# Patient Record
Sex: Male | Born: 1960 | Race: White | Hispanic: No | Marital: Married | Smoking: Never smoker
Health system: Southern US, Community
[De-identification: ages and names within clinical notes are randomized; demographics above are authoritative.]

## PROBLEM LIST (undated history)

## (undated) HISTORY — PX: TOTAL HIP ARTHROPLASTY: SHX124

---

## 2019-01-18 ENCOUNTER — Other Ambulatory Visit: Payer: Self-pay

## 2019-01-18 ENCOUNTER — Emergency Department (INDEPENDENT_AMBULATORY_CARE_PROVIDER_SITE_OTHER)
Admission: EM | Admit: 2019-01-18 | Discharge: 2019-01-18 | Disposition: A | Discharge status: Home / Self Care | Payer: 59 | Source: Home / Self Care

## 2019-01-18 DIAGNOSIS — R040 Epistaxis: Secondary | ICD-10-CM | POA: Diagnosis not present

## 2019-01-18 NOTE — ED Triage Notes (Signed)
Pt c/o severe nosebleed that started just before 11 am at work. Worked on it for 15 mins before heading to urgent care.

## 2019-01-18 NOTE — ED Provider Notes (Signed)
[name]    CSN: [id number] Arrival date & time: 01/18/19  1201     History   Chief Complaint Chief Complaint  Patient presents with  . Epistaxis    HPI Jason Rocha is a 58 y.o. male.   58 yo man with epistaxis for 1 hour PTA from both nostrils.  No recent URI or allergies.  No h/o epistaxis.  No allergies, hypertension, aspirin use.  No recent trauma.  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  Initial visit to [facility].     History reviewed. No pertinent past medical history.  There are no active problems to display for this patient.   History reviewed. No pertinent surgical history.     Home Medications    Prior to Admission medications   Not on File    Family History History reviewed. No pertinent family history.  Social History Social History   Tobacco Use  . Smoking status: Never Smoker  . Smokeless tobacco: Never Used  Substance Use Topics  . Alcohol use: Yes    Frequency: Never    Comment: occ  . Drug use: Never     Allergies   Patient has no known allergies.   Review of Systems Review of Systems   Physical Exam Triage Vital Signs ED Triage Vitals  Enc Vitals Group     BP      Pulse      Resp      Temp      Temp src      SpO2      Weight      Height      Head Circumference      Peak Flow      Pain Score      Pain Loc      Pain Edu?      Excl. in GC?    No data found.  Updated Vital Signs BP (!) 145/82 (BP Location: Right Arm)   Pulse 92   Resp 18   SpO2 93%    Physical Exam Vitals signs and nursing note reviewed.  Constitutional:      Appearance: Normal appearance. He is obese. He is not ill-appearing.     Comments: Patient actively bleeding, brought back immediately to procedure room where compression initiated followed by oxymetazoline spray bilaterally.  HENT:     Nose:     Comments: As above,  active bleeding from both nasal passages with no subsequent bleeding.  Entire septum white on both sides with no friable mucosa noted.    Mouth/Throat:     Comments: Patient expectorated large clot Eyes:     Conjunctiva/sclera: Conjunctivae normal.  Cardiovascular:     Rate and Rhythm: Normal rate and regular rhythm.     Pulses: Normal pulses.  Pulmonary:     Effort: Pulmonary effort is normal.  Musculoskeletal:        General: No signs of injury.  Skin:    General: Skin is warm and dry.  Neurological:     General: No focal deficit present.     Mental Status: He is alert.      UC Treatments / Results  Labs (all labs ordered are listed, but only abnormal results are displayed) Labs Reviewed - No data to display  EKG None  Radiology No results found.  Procedures Procedures (including critical care time)  Medications Ordered in UC  Medications - No data to display  Initial Impression / Assessment and Plan / UC Course  I have reviewed the triage vital signs and the nursing notes.  Pertinent labs & imaging results that were available during my care of the patient were reviewed by me and considered in my medical decision making (see chart for details).    Final Clinical Impressions(s) / UC Diagnoses   Final diagnoses:  Epistaxis     Discharge Instructions     Use the oxymetazoline spray again tonight.  You need to have a recheck in a week, either here or with your primary care provider or ear-nose-throat specialist to recheck the lining of the nose.  Avoid blowing the nose (sniffing is okay) for 24 hours.  No antihistamines since they will dry out the nose.  No aspirin since it increases the risk of bleeding.    ED Prescriptions    None     Controlled Substance Prescriptions [place] Controlled Substance Registry consulted? Not Applicable   Elvina Sidle, MD 01/18/19 1249

## 2019-01-18 NOTE — Discharge Instructions (Addendum)
Use the oxymetazoline spray again tonight.  You need to have a recheck in a week, either here or with your primary care provider or ear-nose-throat specialist to recheck the lining of the nose.  Avoid blowing the nose (sniffing is okay) for 24 hours.  No antihistamines since they will dry out the nose.  No aspirin since it increases the risk of bleeding.

## 2019-01-29 ENCOUNTER — Other Ambulatory Visit: Payer: Self-pay

## 2019-01-29 ENCOUNTER — Emergency Department (INDEPENDENT_AMBULATORY_CARE_PROVIDER_SITE_OTHER): Payer: 59

## 2019-01-29 ENCOUNTER — Emergency Department (INDEPENDENT_AMBULATORY_CARE_PROVIDER_SITE_OTHER)
Admission: EM | Admit: 2019-01-29 | Discharge: 2019-01-29 | Disposition: A | Discharge status: Home / Self Care | Payer: 59 | Source: Home / Self Care

## 2019-01-29 DIAGNOSIS — R05 Cough: Secondary | ICD-10-CM

## 2019-01-29 DIAGNOSIS — R0982 Postnasal drip: Secondary | ICD-10-CM

## 2019-01-29 DIAGNOSIS — R059 Cough, unspecified: Secondary | ICD-10-CM

## 2019-01-29 MED ORDER — CETIRIZINE HCL 10 MG PO TABS: 10.0000 mg | ORAL_TABLET | Freq: Every day | ORAL | 0 refills | Status: AC

## 2019-01-29 MED ORDER — IPRATROPIUM BROMIDE 0.06 % NA SOLN: 2.0000 | Freq: Four times a day (QID) | NASAL | 1 refills | Status: AC

## 2019-01-29 NOTE — ED Provider Notes (Signed)
[name]    CSN: [id number] Arrival date & time: 01/29/19  9924     History   Chief Complaint Chief Complaint  Patient presents with  . Cough    HPI Jason Rocha is a 58 y.o. male.   HPI  Jason Rocha is a 58 y.o. male presenting to UC with c/o chronic intermittent dry cough with occasional post-nasal drip for 4 weeks. Denies chest pain or SOB. Denies fever or chills. He has taken OTC cough/cold medication including Delsym w/o relief.  Pt moved to the area about 3 years ago but does not have a PCP. He does not take any prescription medication and does not take any allergy medication.    History reviewed. No pertinent past medical history.  There are no active problems to display for this patient.   Past Surgical History:  Procedure Laterality Date  . TOTAL HIP ARTHROPLASTY         Home Medications    Prior to Admission medications   Medication Sig Start Date End Date Taking? Authorizing Provider  cetirizine (ZYRTEC) 10 MG tablet Take 1 tablet (10 mg total) by mouth daily. 01/29/19   Lurene Shadow, PA-C  ipratropium (ATROVENT) 0.06 % nasal spray Place 2 sprays into both nostrils 4 (four) times daily. 01/29/19   Lurene Shadow, PA-C    Family History History reviewed. No pertinent family history.  Social History Social History   Tobacco Use  . Smoking status: Never Smoker  . Smokeless tobacco: Never Used  Substance Use Topics  . Alcohol use: Yes    Frequency: Never    Comment: occ  . Drug use: Never     Allergies   Patient has no known allergies.   Review of Systems Review of Systems  Constitutional: Negative for chills and fever.  HENT: Positive for postnasal drip. Negative for congestion, ear pain, sore throat, trouble swallowing and voice change.   Respiratory: Positive for cough. Negative for chest tightness, shortness of breath and wheezing.   Cardiovascular: Negative for chest pain and palpitations.  Gastrointestinal: Negative  for abdominal pain, diarrhea, nausea and vomiting.  Musculoskeletal: Negative for arthralgias, back pain and myalgias.  Skin: Negative for rash.     Physical Exam Triage Vital Signs ED Triage Vitals  Enc Vitals Group     BP 01/29/19 1013 123/86     Pulse Rate 01/29/19 1013 98     Resp 01/29/19 1013 20     Temp 01/29/19 1013 98 F (36.7 C)     Temp Source 01/29/19 1013 Oral     SpO2 01/29/19 1013 95 %     Weight 01/29/19 1014 268 lb (121.6 kg)     Height 01/29/19 1014 6\' 2"  (1.88 m)     Head Circumference --      Peak Flow --      Pain Score 01/29/19 1014 0     Pain Loc --      Pain Edu? --      Excl. in GC? --    No data found.  Updated Vital Signs BP 123/86 (BP Location: Right Arm)   Pulse 98   Temp 98 F (36.7 C) (Oral)   Resp 20   Ht 6\' 2"  (1.88 m)   Wt 268 lb (121.6 kg)   SpO2 95%   BMI 34.41 kg/m   Visual Acuity Right Eye Distance:   Left Eye Distance:   Bilateral Distance:    Right Eye Near:   Left  Eye Near:    Bilateral Near:     Physical Exam Vitals signs and nursing note reviewed.  Constitutional:      Appearance: Normal appearance. He is well-developed.  HENT:     Head: Normocephalic and atraumatic.     Right Ear: Tympanic membrane normal.     Left Ear: Tympanic membrane normal.     Nose: Nose normal.     Right Sinus: No maxillary sinus tenderness or frontal sinus tenderness.     Left Sinus: No maxillary sinus tenderness or frontal sinus tenderness.     Mouth/Throat:     Lips: Pink.     Mouth: Mucous membranes are moist.     Pharynx: Oropharynx is clear. Uvula midline. Posterior oropharyngeal erythema present. No pharyngeal swelling, oropharyngeal exudate or uvula swelling.     Comments: Small amount of white phlegm visualized in posterior pharynx Neck:     Musculoskeletal: Normal range of motion.  Cardiovascular:     Rate and Rhythm: Normal rate and regular rhythm.  Pulmonary:     Effort: Pulmonary effort is normal. No respiratory  distress.     Breath sounds: Normal breath sounds. No stridor. No wheezing or rhonchi.  Musculoskeletal: Normal range of motion.  Skin:    General: Skin is warm and dry.  Neurological:     Mental Status: He is alert and oriented to person, place, and time.  Psychiatric:        Behavior: Behavior normal.      UC Treatments / Results  Labs (all labs ordered are listed, but only abnormal results are displayed) Labs Reviewed - No data to display  EKG None  Radiology Dg Chest 2 View  Result Date: 01/29/2019 CLINICAL DATA:  Cough EXAM: CHEST - 2 VIEW COMPARISON:  None. FINDINGS: The heart size and mediastinal contours are within normal limits. Both lungs are clear. The visualized skeletal structures are unremarkable. IMPRESSION: No acute abnormality of the lungs. Electronically Signed   By: Lauralyn Primes M.D.   On: 01/29/2019 10:41    Procedures Procedures (including critical care time)  Medications Ordered in UC Medications - No data to display  Initial Impression / Assessment and Plan / UC Course  I have reviewed the triage vital signs and the nursing notes.  Pertinent labs & imaging results that were available during my care of the patient were reviewed by me and considered in my medical decision making (see chart for details).     Reassured pt of normal CXR No evidence of underlying bacterial infection at this time Will try ipratropium nasal spray and cetirizine Home care info provided  Final Clinical Impressions(s) / UC Diagnoses   Final diagnoses:  Cough  Post-nasal drip     Discharge Instructions      It is recommended you start using today's prescribed medications daily for at least 1-2 weeks to help with cough.  Please schedule an appointment to establish care with a primary care provider for ongoing healthcare needs including chronic cough.  Call 911 or go to the hospital if you develop chest pain or difficulty breathing.     ED Prescriptions     Medication Sig Dispense Auth. Provider   ipratropium (ATROVENT) 0.06 % nasal spray Place 2 sprays into both nostrils 4 (four) times daily. 15 mL Doroteo Glassman, Jaeshawn Silvio O, PA-C   cetirizine (ZYRTEC) 10 MG tablet Take 1 tablet (10 mg total) by mouth daily. 30 tablet Lurene Shadow, [place]     Controlled Substance Prescriptions [place]  Controlled Substance Registry consulted? Not Applicable   Rolla Plate 01/29/19 1204

## 2019-01-29 NOTE — Discharge Instructions (Signed)
°  It is recommended you start using today's prescribed medications daily for at least 1-2 weeks to help with cough.  Please schedule an appointment to establish care with a primary care provider for ongoing healthcare needs including chronic cough.  Call 911 or go to the hospital if you develop chest pain or difficulty breathing.

## 2019-01-29 NOTE — ED Triage Notes (Signed)
Pt states that he has had a chronic cough x 4 weeks.  Has treated with OTC cough and cold medication.  Cough is continuous, denies fever and SOB.  Has been taking delsym, but not working.

## 2019-10-06 ENCOUNTER — Emergency Department
Admission: EM | Admit: 2019-10-06 | Discharge: 2019-10-06 | Disposition: A | Discharge status: Home / Self Care | Payer: 59 | Source: Home / Self Care | Attending: Family Medicine | Admitting: Family Medicine

## 2019-10-06 ENCOUNTER — Other Ambulatory Visit: Payer: Self-pay

## 2019-10-06 DIAGNOSIS — R509 Fever, unspecified: Secondary | ICD-10-CM

## 2019-10-06 DIAGNOSIS — U071 COVID-19: Secondary | ICD-10-CM

## 2019-10-06 DIAGNOSIS — R05 Cough: Secondary | ICD-10-CM

## 2019-10-06 DIAGNOSIS — R5383 Other fatigue: Secondary | ICD-10-CM | POA: Diagnosis not present

## 2019-10-06 LAB — POC SARS CORONAVIRUS 2 AG -  ED: SARS Coronavirus 2 Ag: POSITIVE — AB

## 2019-10-06 NOTE — ED Triage Notes (Signed)
Pt c/o cough that started yesterday. Fatigue all day yesterday. Fever of 100.3 earlier. No known covid exposure. Taking Mucinex, tylenol and delsym prn.

## 2019-10-06 NOTE — Discharge Instructions (Addendum)
Take plain guaifenesin (1258m extended release tabs such as Mucinex) twice daily, with plenty of water, for cough and congestion.  May add Pseudoephedrine (340m one or two every 4 to 6 hours) for sinus congestion.  Get adequate rest.   May use Afrin nasal spray (or generic oxymetazoline) each morning for about 5 days and then discontinue.  Also recommend using saline nasal spray several times daily and saline nasal irrigation (AYR is a common brand).  Use Flonase nasal spray each morning after using Afrin nasal spray and saline nasal irrigation. Try warm salt water gargles for sore throat.  Stop all antihistamines for now, and other non-prescription cough/cold preparations. May take Ibuprofen 20075m4 tabs every 8 hours with food for body aches, fever, headache, etc. May take Delsym Cough Suppressant at bedtime for nighttime cough.   If COVID-19 test is positive, isolate yourself until the below conditions are met: 1)  At least 7 days since symptoms onset. AND 2)  > 72 hours after symptom resolution (absence of fever without the use of fever-reducing medicine, and improvement in respiratory symptoms.

## 2019-10-06 NOTE — ED Provider Notes (Signed)
[facility]    CSN: [id number] Arrival date & time: 10/06/19  1446      History   Chief Complaint Chief Complaint  Patient presents with  . Cough    HPI Jason Rocha is a 58 y.o. male.   Patient felt fatigued yesterday and developed a non-productive cough.  He has had fever as high as 100.3, chills, myalgias, and headache.  He denies chest tightness, shortness of breath, and changes in taste/smell.    The history is provided by the patient.    History reviewed. No pertinent past medical history.  There are no active problems to display for this patient.   Past Surgical History:  Procedure Laterality Date  . TOTAL HIP ARTHROPLASTY         Home Medications    Prior to Admission medications   Medication Sig Start Date End Date Taking? Authorizing Provider  cetirizine (ZYRTEC) 10 MG tablet Take 1 tablet (10 mg total) by mouth daily. 01/29/19   Noe Gens, PA-C  ipratropium (ATROVENT) 0.06 % nasal spray Place 2 sprays into both nostrils 4 (four) times daily. 01/29/19   Noe Gens, PA-C    Family History History reviewed. No pertinent family history.  Social History Social History   Tobacco Use  . Smoking status: Never Smoker  . Smokeless tobacco: Never Used  Substance Use Topics  . Alcohol use: Yes    Frequency: Never    Comment: occ  . Drug use: Never     Allergies   Patient has no known allergies.   Review of Systems Review of Systems No sore throat + cough No pleuritic pain No wheezing No nasal congestion No post-nasal drainage No sinus pain/pressure No itchy/red eyes No earache No hemoptysis No SOB + fever, + chills No nausea No vomiting No abdominal pain No diarrhea No urinary symptoms No skin rash + fatigue + myalgias + headache Used OTC meds without relief   Physical Exam Triage Vital Signs ED Triage Vitals [10/06/19 1642]  Enc Vitals Group     BP 115/73     Pulse Rate (!) 106     Resp 18     Temp  98.6 F (37 C)     Temp Source Oral     SpO2 94 %     Weight 266 lb 12.1 oz (121 kg)     Height _0  (1.88 m)     Head Circumference      Peak Flow      Pain Score 0     Pain Loc      Pain Edu?      Excl. in [place]?    No data found.  Updated Vital Signs BP 115/73 (BP Location: Right Arm)   Pulse (!) 106   Temp 98.6 F (37 C) (Oral)   Resp 18   Ht _1  (1.88 m)   Wt 121 kg   SpO2 94%   BMI 34.25 kg/m   Visual Acuity Right Eye Distance:   Left Eye Distance:   Bilateral Distance:    Right Eye Near:   Left Eye Near:    Bilateral Near:     Physical Exam Nursing notes and Vital Signs reviewed. Appearance:  Patient appears stated age, and in no acute distress Eyes:  Pupils are equal, round, and reactive to light and accomodation.  Extraocular movement is intact.  Conjunctivae are not inflamed  Ears:  Canals normal.  Tympanic membranes normal.  Nose:  Mildly  congested turbinates.  No sinus tenderness.  Pharynx:  Normal Neck:  Supple.  No adenopathy. Lungs:  Clear to auscultation.  Breath sounds are equal.  Moving air well. Heart:  Regular rate and rhythm without murmurs, rubs, or gallops.  Abdomen:  Nontender without masses or hepatosplenomegaly.  Bowel sounds are present.  No CVA or flank tenderness.  Extremities:  No edema.  Skin:  No rash present.   UC Treatments / Results  Labs (all labs ordered are listed, but only abnormal results are displayed) Labs Reviewed  POC SARS CORONAVIRUS 2 AG -  ED - Abnormal; Notable for the following components:      Result Value   SARS Coronavirus 2 Ag Positive (*)    All other components within normal limits    EKG   Radiology No results found.  Procedures Procedures (including critical care time)  Medications Ordered in UC Medications - No data to display  Initial Impression / Assessment and Plan / UC Course  I have reviewed the triage vital signs and the nursing notes.  Pertinent labs & imaging results that were  available during my care of the patient were reviewed by me and considered in my medical decision making (see chart for details).    Benign exam.  There is no evidence of bacterial infection today.  Treat symptomatically for now    Final Clinical Impressions(s) / UC Diagnoses   Final diagnoses:  XLLIY-20 virus infection     Discharge Instructions     Take plain guaifenesin (1287m extended release tabs such as Mucinex) twice daily, with plenty of water, for cough and congestion.  May add Pseudoephedrine (327m one or two every 4 to 6 hours) for sinus congestion.  Get adequate rest.   May use Afrin nasal spray (or generic oxymetazoline) each morning for about 5 days and then discontinue.  Also recommend using saline nasal spray several times daily and saline nasal irrigation (AYR is a common brand).  Use Flonase nasal spray each morning after using Afrin nasal spray and saline nasal irrigation. Try warm salt water gargles for sore throat.  Stop all antihistamines for now, and other non-prescription cough/cold preparations. May take Ibuprofen 20076m4 tabs every 8 hours with food for body aches, fever, headache, etc. May take Delsym Cough Suppressant at bedtime for nighttime cough.   If COVID-19 test is positive, isolate yourself until the below conditions are met: 1)  At least 7 days since symptoms onset. AND 2)  > 72 hours after symptom resolution (absence of fever without the use of fever-reducing medicine, and improvement in respiratory symptoms.        ED Prescriptions    None        BeeKandra NicolasD 10/11/19 1102

## 2020-07-04 IMAGING — DX CHEST - 2 VIEW
2 series · 2 of 2 positions shown · non-contrast
Comparison: None.

CLINICAL DATA: Cough

EXAM:
CHEST - 2 VIEW

[chest pa]
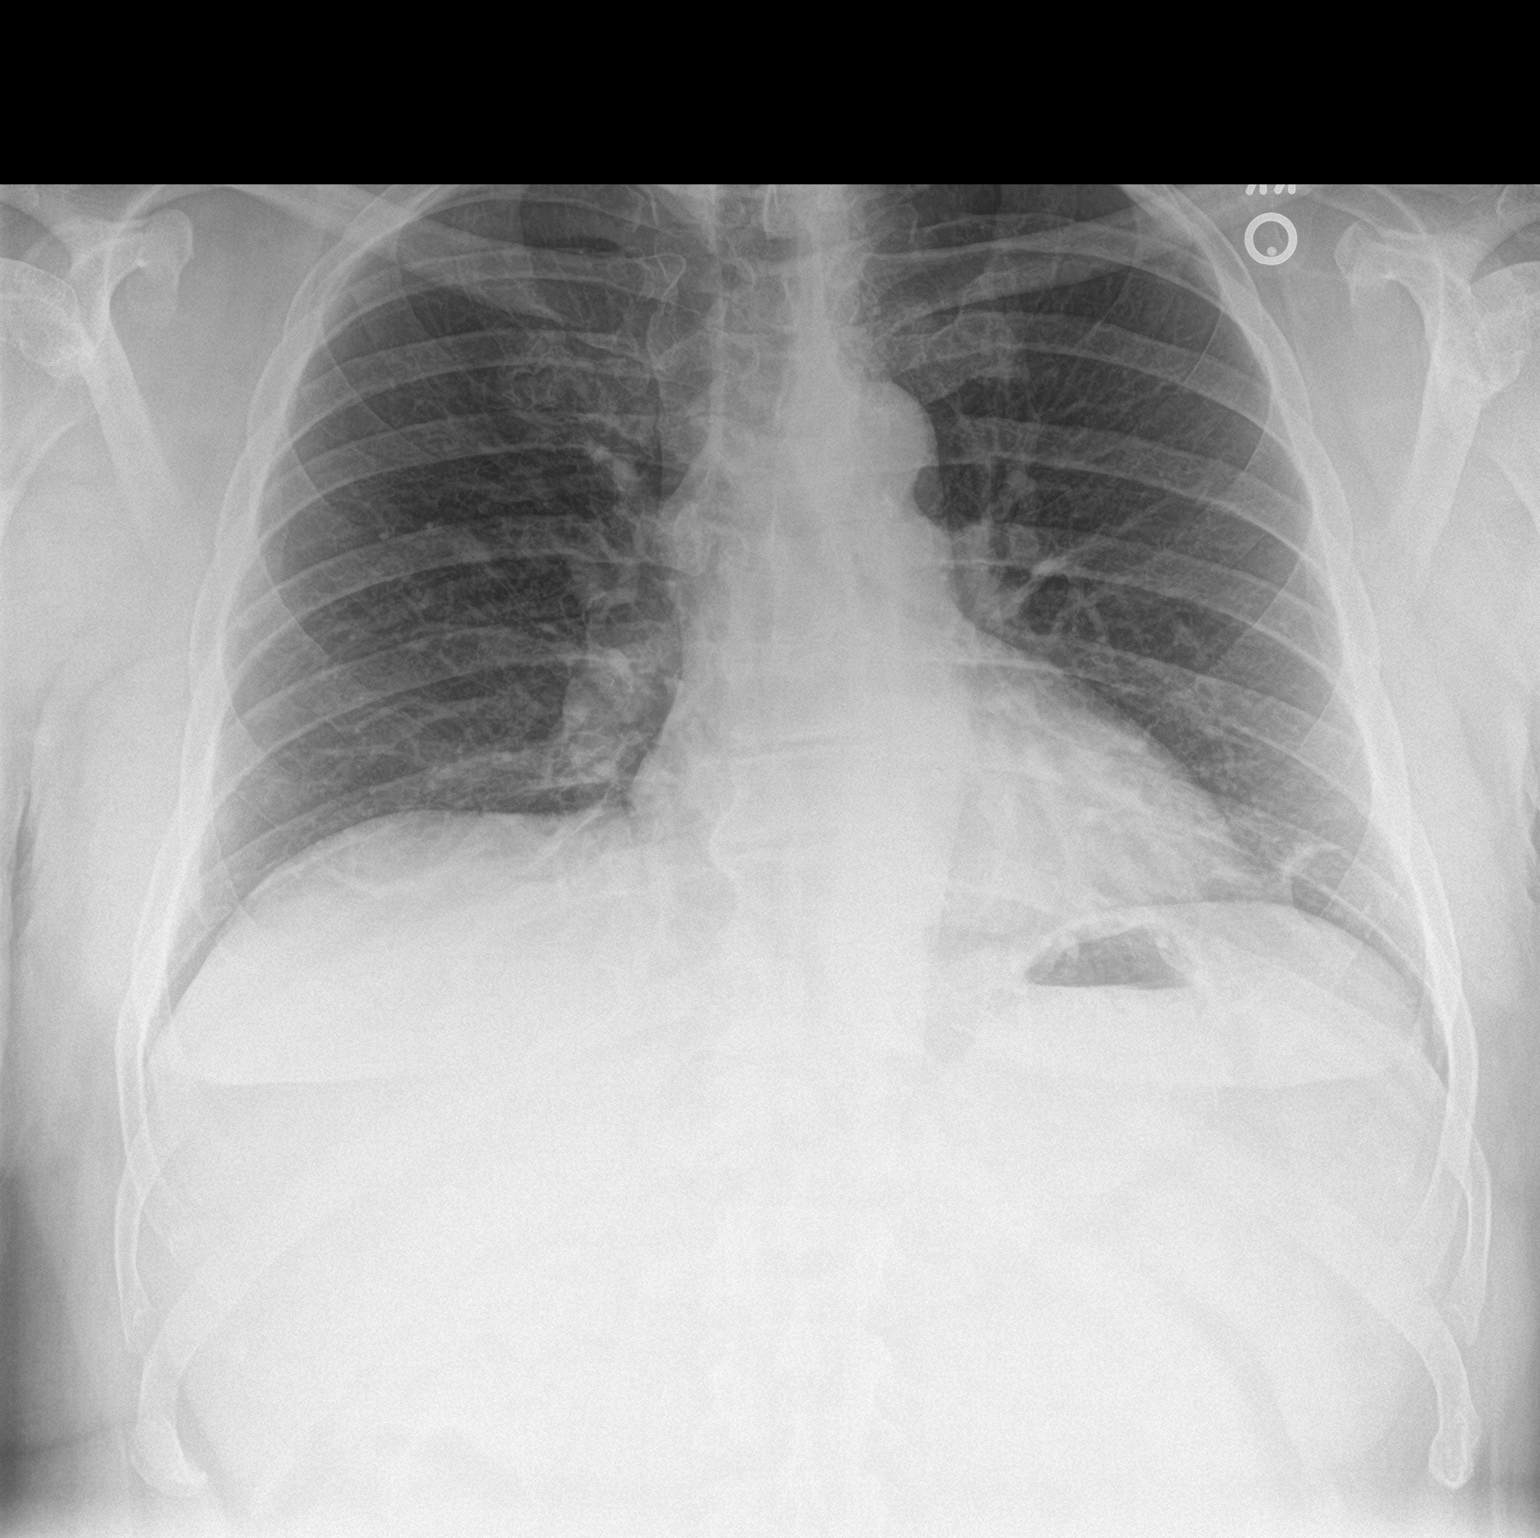

[chest lat]
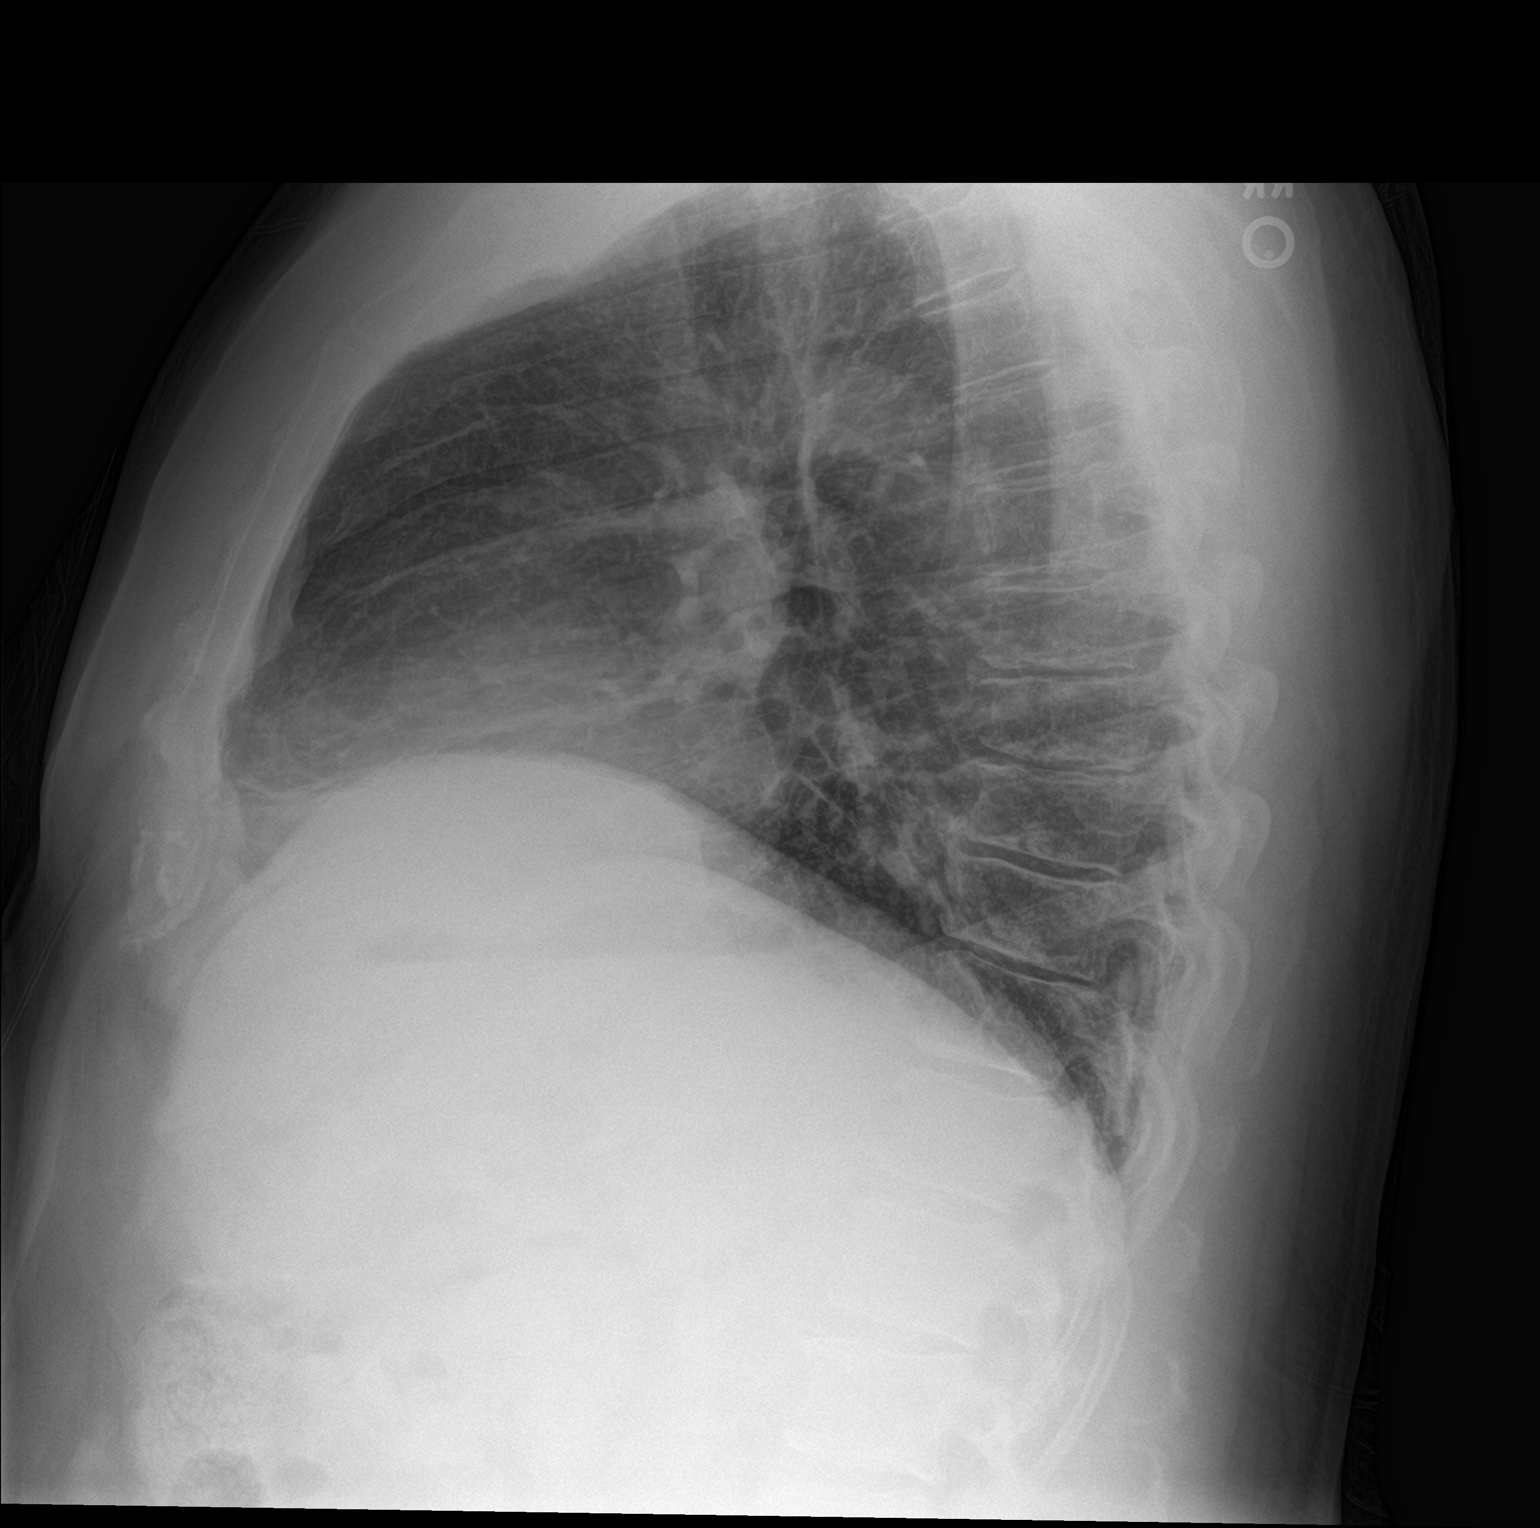

[2 of 2 positions shown; findings below may reference images not displayed]

FINDINGS: The heart size and mediastinal contours are within normal limits.
Both lungs are clear. The visualized skeletal structures are
unremarkable.
IMPRESSION: No acute abnormality of the lungs.

## 2023-03-01 ENCOUNTER — Other Ambulatory Visit: Payer: Self-pay | Admitting: Family Medicine

## 2023-03-01 DIAGNOSIS — R590 Localized enlarged lymph nodes: Secondary | ICD-10-CM

## 2023-04-07 ENCOUNTER — Other Ambulatory Visit: Payer: Self-pay | Admitting: Family Medicine

## 2023-04-07 ENCOUNTER — Ambulatory Visit
Admission: RE | Admit: 2023-04-07 | Discharge: 2023-04-07 | Disposition: A | Discharge status: Home / Self Care | Payer: 59 | Source: Ambulatory Visit | Attending: Family Medicine | Admitting: Family Medicine

## 2023-04-07 DIAGNOSIS — R590 Localized enlarged lymph nodes: Secondary | ICD-10-CM

## 2023-04-07 MED ORDER — IOPAMIDOL (ISOVUE-300) INJECTION 61%
75.0000 mL | Freq: Once | INTRAVENOUS | Status: AC | PRN
  Administered 2023-04-07: 75 mL via INTRAVENOUS

## 2024-11-12 ENCOUNTER — Other Ambulatory Visit: Payer: Self-pay | Admitting: Urology

## 2024-11-12 DIAGNOSIS — R972 Elevated prostate specific antigen [PSA]: Secondary | ICD-10-CM

## 2025-03-03 ENCOUNTER — Other Ambulatory Visit
